# Patient Record
Sex: Male | Born: 1980 | Race: White | Hispanic: No | Marital: Married | Smoking: Never smoker
Health system: Southern US, Community
[De-identification: ages and names within clinical notes are randomized; demographics above are authoritative.]

## PROBLEM LIST (undated history)

## (undated) HISTORY — PX: KNEE ARTHROSCOPY: SUR90

---

## 2008-08-25 ENCOUNTER — Emergency Department: Payer: Self-pay | Admitting: Emergency Medicine

## 2009-03-01 ENCOUNTER — Emergency Department: Payer: Self-pay | Admitting: Emergency Medicine

## 2009-03-06 ENCOUNTER — Emergency Department: Payer: Self-pay | Admitting: Emergency Medicine

## 2009-03-08 ENCOUNTER — Emergency Department: Payer: Self-pay | Admitting: Emergency Medicine

## 2009-03-13 ENCOUNTER — Ambulatory Visit: Payer: Self-pay | Admitting: Internal Medicine

## 2009-07-01 ENCOUNTER — Emergency Department: Payer: Self-pay | Admitting: Emergency Medicine

## 2009-07-02 ENCOUNTER — Emergency Department: Payer: Self-pay | Admitting: Emergency Medicine

## 2009-08-11 ENCOUNTER — Emergency Department: Payer: Self-pay | Admitting: Emergency Medicine

## 2009-09-15 ENCOUNTER — Inpatient Hospital Stay: Payer: Self-pay | Admitting: Internal Medicine

## 2010-02-15 ENCOUNTER — Emergency Department: Payer: Self-pay | Admitting: Emergency Medicine

## 2010-04-03 ENCOUNTER — Emergency Department: Payer: Self-pay | Admitting: Emergency Medicine

## 2010-05-21 ENCOUNTER — Ambulatory Visit: Payer: Self-pay | Admitting: Family Medicine

## 2011-10-03 IMAGING — CT CT HEAD WITHOUT CONTRAST
2 series · 16 of 30 positions shown, 20 images · non-contrast
Comparison: none

REASON FOR EXAM: 4 week of increasing headache
COMMENTS:

PROCEDURE:     CT  - CT HEAD WITHOUT CONTRAST  - February 15, 2010  [DATE]
RESULT:     Comparison:  None
TECHNIQUE: Multiple axial images from the foramen magnum to the vertex were
obtained without IV contrast.

[Series 2: without · axial · non-contrast · 0.46mm/px · z∈[+354,+489]mm · 13 of 33 slices shown, 17 images]
[im 3/33  brain]
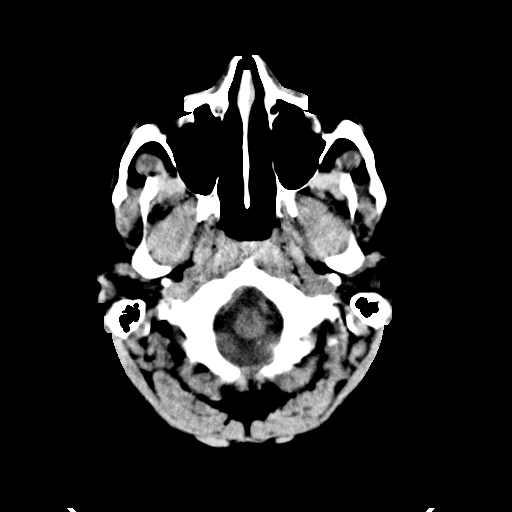
[im 3/33  bone]
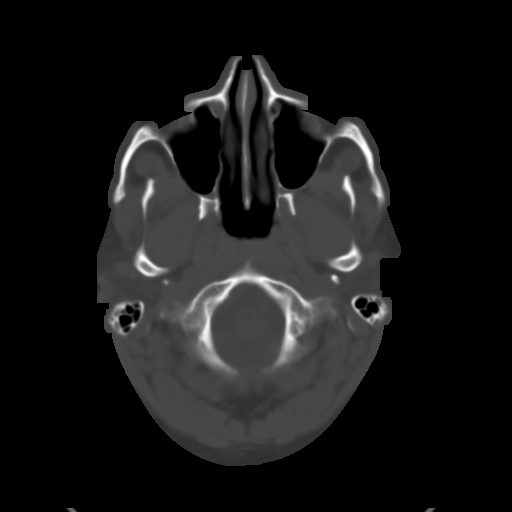
[im 5/33  brain]
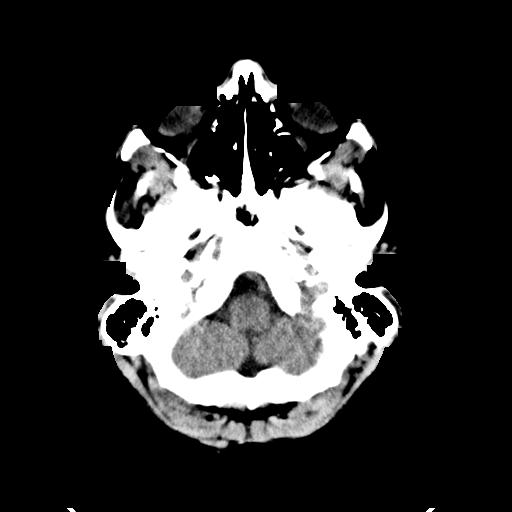
[im 7/33  brain]
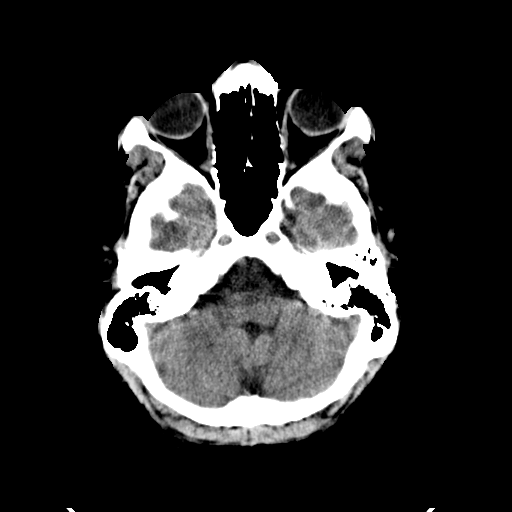
[im 10/33  brain]
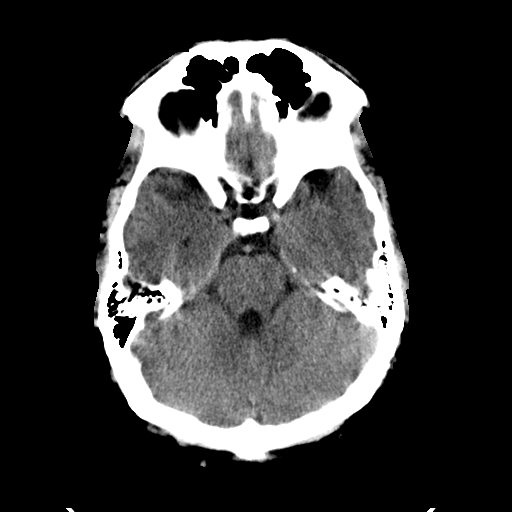
[im 12/33  brain]
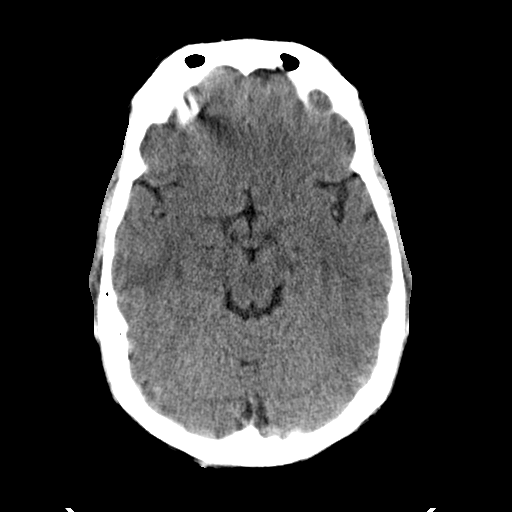
[im 12/33  bone]
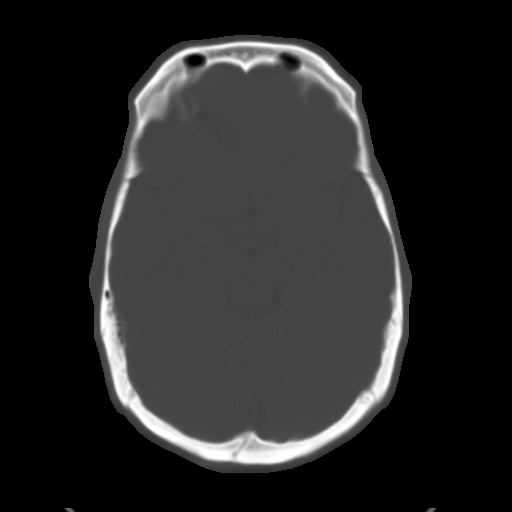
[im 14/33  brain]
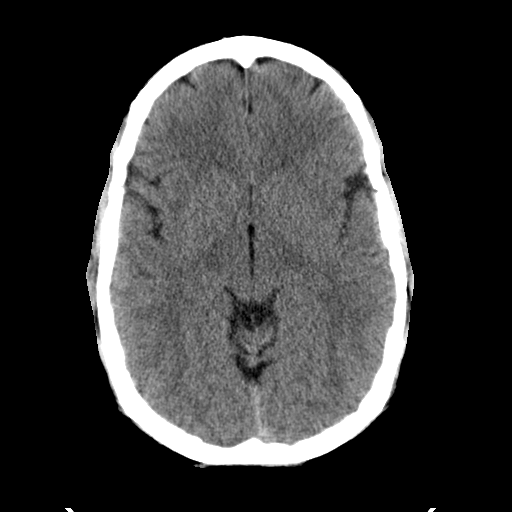
[im 17/33  brain]
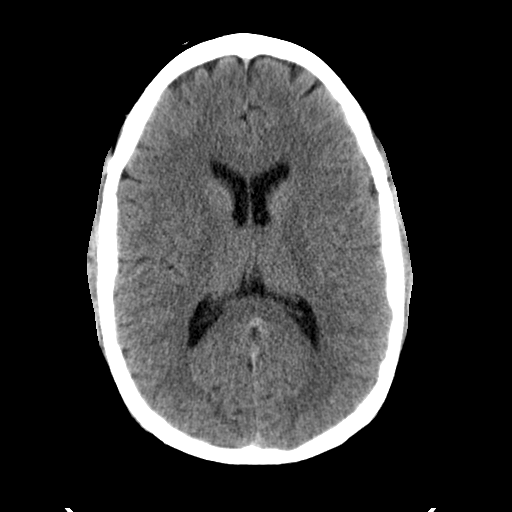
[im 19/33  brain]
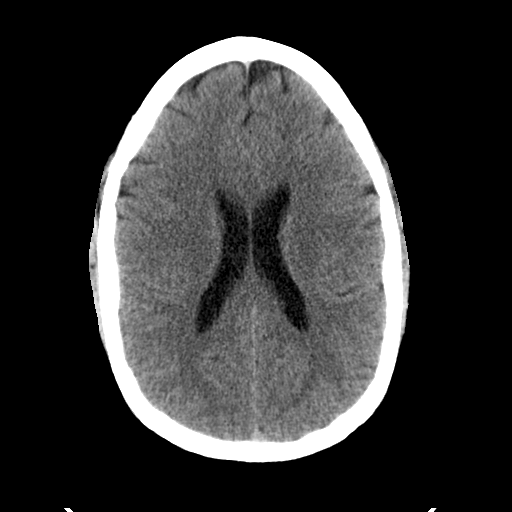
[im 21/33  brain]
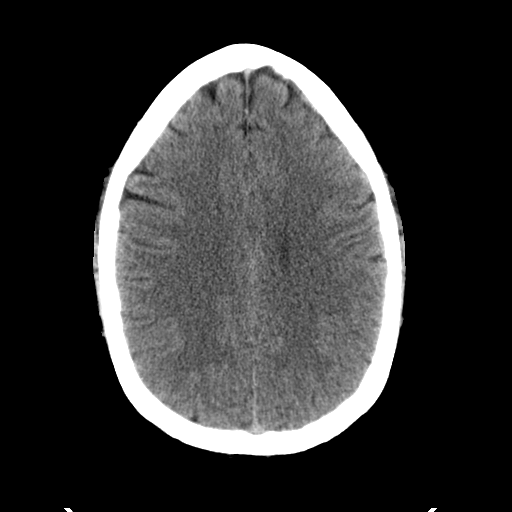
[im 21/33  bone]
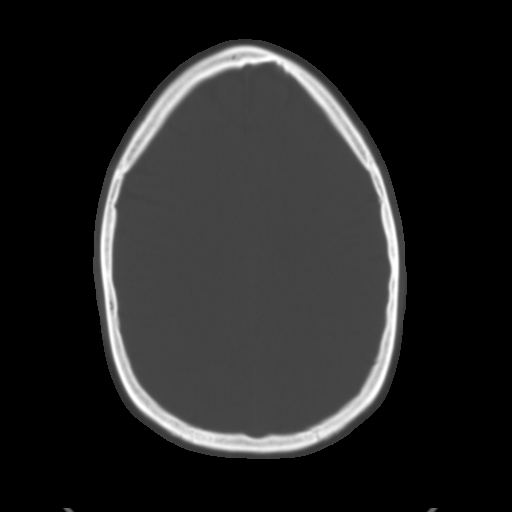
[im 23/33  brain]
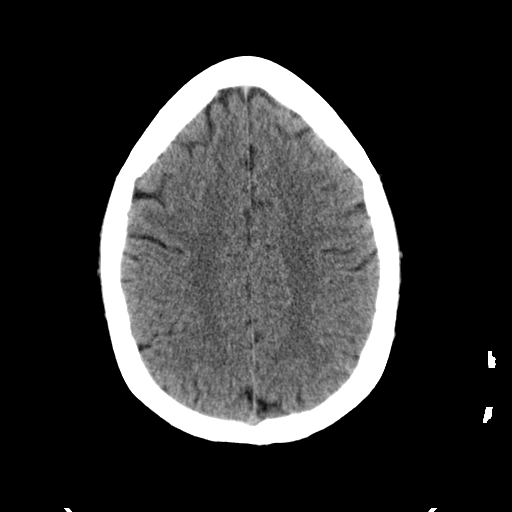
[im 26/33  brain]
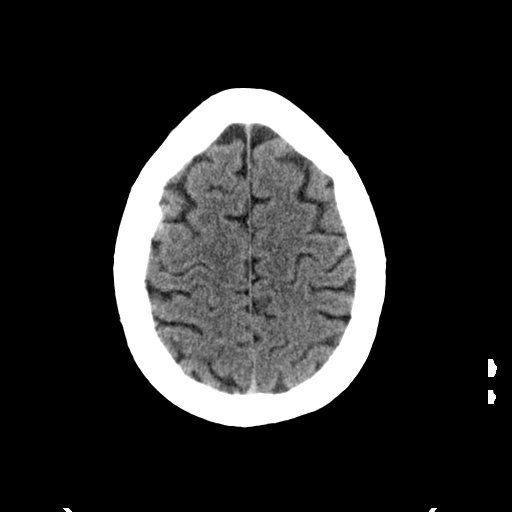
[im 28/33  brain]
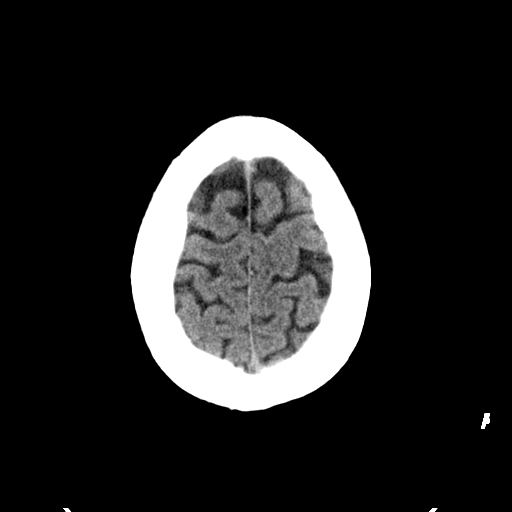
[im 30/33  brain]
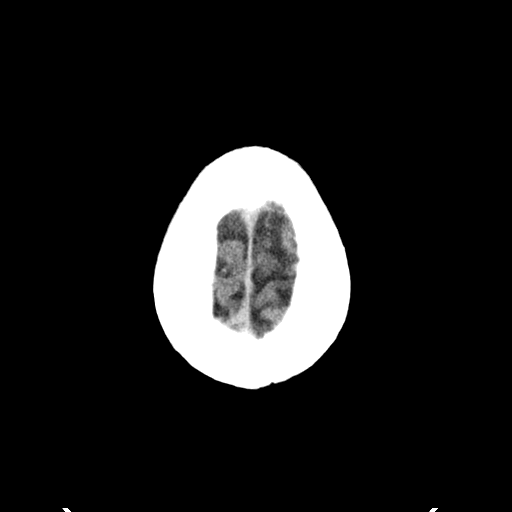
[im 30/33  bone]
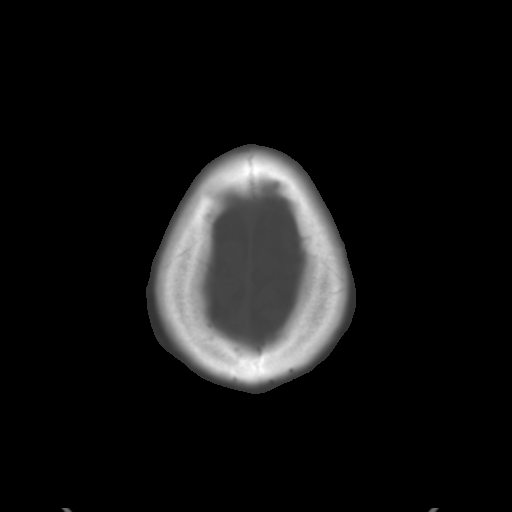

[Series 3: bone · axial · 0.46mm/px · z∈[+354,+399]mm · 3 of 33 slices shown]
[im 3/33  bone]
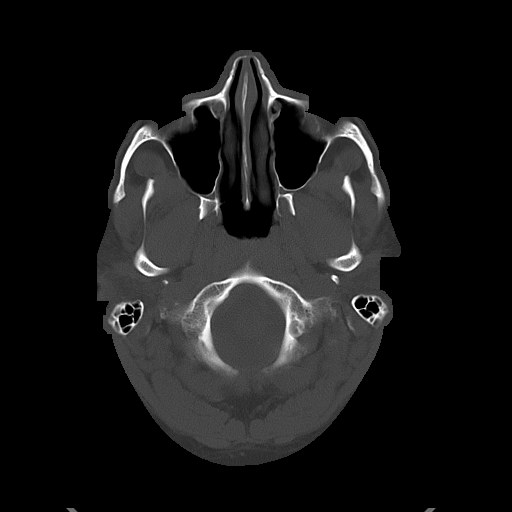
[im 7/33  bone]
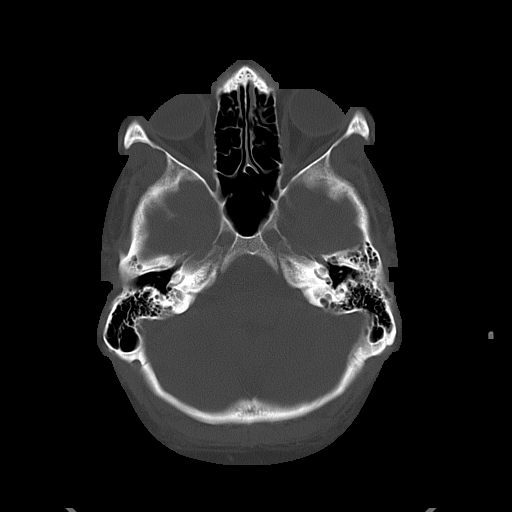
[im 12/33  bone]
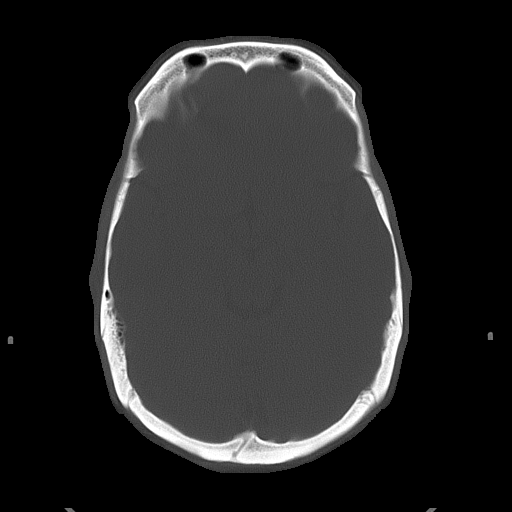

[16 of 30 positions shown; findings below may reference images not displayed]

FINDINGS: There is no evidence of mass effect, midline shift, or extra-axial fluid
collections.  There is no evidence of a space-occupying lesion or
intracranial hemorrhage. There is no evidence of a cortical-based area of
acute infarction.

The ventricles and sulci are appropriate for the patient's age. The basal
cisterns are patent.

Visualized portions of the orbits are unremarkable. The visualized portions
of the paranasal sinuses and mastoid air cells are unremarkable.

The osseous structures are unremarkable.
IMPRESSION: No acute intracranial process.

## 2018-02-14 ENCOUNTER — Ambulatory Visit
Admission: EM | Admit: 2018-02-14 | Discharge: 2018-02-14 | Disposition: A | Discharge status: Home / Self Care | Payer: Self-pay | Attending: Emergency Medicine | Admitting: Emergency Medicine

## 2018-02-14 ENCOUNTER — Other Ambulatory Visit: Payer: Self-pay

## 2018-02-14 DIAGNOSIS — J029 Acute pharyngitis, unspecified: Secondary | ICD-10-CM | POA: Insufficient documentation

## 2018-02-14 LAB — RAPID STREP SCREEN (MED CTR MEBANE ONLY): Streptococcus, Group A Screen (Direct): NEGATIVE

## 2018-02-14 NOTE — ED Provider Notes (Signed)
[facility]    CSN: [id number] Arrival date & time: 02/14/18  0810     History   Chief Complaint Chief Complaint  Patient presents with  . Sore Throat    HPI Dartha LodgeJoshua Bluett is a 37 y.o. male.   HPI  -year-old male presents with sore throat that started about a week ago.  He states that he is noticed some swelling and some soreness on the right side submandibular there recently has made his inside of his throat sore.  7049-month-old has recently recovered from a severe ear infection that required several courses of antibiotics before "cure.  Is a non-smoker.  Denies any fever or chills.  It affects only the right side of his throat.  He is concerned regarding a small lump on the right side about the size of a fingertip.  He states that he was concerned because it is sore.  He has not noticed any growth.  He denies any weight loss.         History reviewed. No pertinent past medical history.  There are no active problems to display for this patient.   Past Surgical History:  Procedure Laterality Date  . KNEE ARTHROSCOPY Right        Home Medications    Prior to Admission medications   Medication Sig Start Date End Date Taking? Authorizing Provider  Turmeric 400 MG CAPS Take by mouth.   Yes [provider]    Family History Family History  Problem Relation Age of Onset  . Renal cancer Father     Social History Social History   Tobacco Use  . Smoking status: Never Smoker  . Smokeless tobacco: Never Used  Substance Use Topics  . Alcohol use: Yes    Comment: occasionally  . Drug use: Not Currently     Allergies   Patient has no known allergies.   Review of Systems Review of Systems  Constitutional: Negative for activity change, appetite change, chills, fatigue and fever.  HENT: Positive for sore throat.   All other systems reviewed and are negative.    Physical Exam Triage Vital Signs ED Triage Vitals  Enc Vitals Group     BP 02/14/18 0825 (!) 142/93     Pulse Rate 02/14/18 0825 67     Resp 02/14/18 0825 18     Temp 02/14/18 0825 98.3 F (36.8 C)     Temp Source 02/14/18 0825 Oral     SpO2 02/14/18 0825 100 %     Weight 02/14/18 0821 220 lb (99.8 kg)     Height 02/14/18 0821 5\' 10"  (1.778 m)     Head Circumference --      Peak Flow --      Pain Score 02/14/18 0821 4     Pain Loc --      Pain Edu? --      Excl. in GC? --    No data found.  Updated Vital Signs BP (!) 142/93 (BP Location: Right Arm)   Pulse 67   Temp 98.3 F (36.8 C) (Oral)   Resp 18   Ht 5\' 10"  (1.778 m)   Wt 220 lb (99.8 kg)   SpO2 100%   BMI 31.57 kg/m   Visual Acuity Right Eye Distance:   Left Eye Distance:   Bilateral Distance:    Right Eye Near:   Left Eye Near:    Bilateral Near:     Physical Exam Vitals signs and nursing note reviewed.  Constitutional:      General: He is not in acute distress.    Appearance: He is well-developed and normal weight. He is not ill-appearing, toxic-appearing or diaphoretic.  HENT:     Head: Normocephalic.     Right Ear: Tympanic membrane and ear canal normal.     Left Ear: Tympanic membrane and ear canal normal.     Nose: No congestion or rhinorrhea.     Comments: And has a very low tender lymph node submandibular.  Fixed to the skin.  Is slightly mobile.    Mouth/Throat:     Mouth: Mucous membranes are moist. No oral lesions.     Pharynx: No pharyngeal swelling, oropharyngeal exudate, posterior oropharyngeal erythema or uvula swelling.     Tonsils: No tonsillar exudate or tonsillar abscesses. Swelling: 0 on the right. 0 on the left.  Eyes:     Conjunctiva/sclera: Conjunctivae normal.  Neck:     Musculoskeletal: Normal range of motion and neck supple.  Pulmonary:     Effort: Pulmonary effort is normal.     Breath sounds: Normal breath sounds.  Lymphadenopathy:     Cervical: Cervical adenopathy present.  Skin:    General: Skin is warm and dry.  Neurological:      General: No focal deficit present.     Mental Status: He is alert and oriented to person, place, and time.  Psychiatric:        Mood and Affect: Mood normal.        Behavior: Behavior normal.      UC Treatments / Results  Labs (all labs ordered are listed, but only abnormal results are displayed) Labs Reviewed  RAPID STREP SCREEN ([facility] ONLY)  CULTURE, GROUP A STREP [facility])    EKG None  Radiology No results found.  Procedures Procedures (including critical care time)  Medications Ordered in UC Medications - No data to display  Initial Impression / Assessment and Plan / UC Course  I have reviewed the triage vital signs and the nursing notes.  Pertinent labs & imaging results that were available during my care of the patient were reviewed by me and considered in my medical decision making (see chart for details).   She has a negative strep test and the lymph node is not alarming.  I have reassured him that this is possibly from previous infection.  He should keep close eye on it as far as any growth changes.  I suspect that it will disappear very shortly.  If it does not he should make an arrangement to follow-up with an ear nose and throat physician.  In the meantime I have told him that the sore throat can be treated conservatively and he likely viral does not require antibiotics.  You can use salt water gargles or lozenges for comfort.  Ibuprofen for soreness.  Results of the cultures and sensitivities will be available in 48 hours.   Final Clinical Impressions(s) / UC Diagnoses   Final diagnoses:  Sore throat   Discharge Instructions   None    ED Prescriptions    None     Controlled Substance Prescriptions [place] Controlled Substance Registry consulted? Not Applicable   Lutricia FeilRoemer, Sosie Gato P, PA-C 02/14/18 [id number]

## 2018-02-14 NOTE — ED Triage Notes (Signed)
Patient complains of sore throat that started 1 week ago. Patient states that he has been noticing a swelling and soreness and then recently inside of throat started to be sore.

## 2018-02-16 LAB — CULTURE, GROUP A STREP (THRC)

## 2018-06-25 ENCOUNTER — Encounter: Payer: Self-pay | Admitting: Emergency Medicine

## 2018-06-25 ENCOUNTER — Ambulatory Visit
Admission: EM | Admit: 2018-06-25 | Discharge: 2018-06-25 | Disposition: A | Discharge status: Home / Self Care | Payer: Self-pay | Attending: Urgent Care | Admitting: Urgent Care

## 2018-06-25 ENCOUNTER — Other Ambulatory Visit: Payer: Self-pay

## 2018-06-25 DIAGNOSIS — J301 Allergic rhinitis due to pollen: Secondary | ICD-10-CM

## 2018-06-25 DIAGNOSIS — R05 Cough: Secondary | ICD-10-CM

## 2018-06-25 DIAGNOSIS — R0789 Other chest pain: Secondary | ICD-10-CM

## 2018-06-25 DIAGNOSIS — R059 Cough, unspecified: Secondary | ICD-10-CM

## 2018-06-25 MED ORDER — ALBUTEROL SULFATE HFA 108 (90 BASE) MCG/ACT IN AERS: 1.0000 | INHALATION_SPRAY | Freq: Four times a day (QID) | RESPIRATORY_TRACT | 0 refills | Status: AC | PRN

## 2018-06-25 NOTE — Discharge Instructions (Signed)
It was very nice meeting you today in clinic. Thank you for entrusting me with your care.  ° °Please utilize the medications that we discussed. Your prescriptions have been called in to your pharmacy.  ° °Make arrangements to follow up with your regular doctor in 1 week for re-evaluation. If your symptoms/condition worsens, please seek follow up care either here or in the ER. Please remember, our [facility] providers are "right here with you" when you need us.  ° °Again, it was my pleasure to take care of you today. Thank you for choosing our clinic. I hope that you start to feel better quickly.  ° °Petrea Fredenburg, MSN, APRN, FNP-C, CEN °Advanced Practice Provider °[facility] ° °

## 2018-06-25 NOTE — ED Provider Notes (Signed)
[address] [place], [place] [address] [phone]   Name: Travis Perez DOB: 13-May-1980 MRN: [id number] CSN: [id number] PCP: System, Pcp Not In  Arrival date and time:  06/25/18 1000  Chief Complaint:  Sinus Problem  NOTE: Prior to seeing the patient today, I have reviewed the triage nursing documentation and vital signs. Clinical staff has updated patient's PMH/PSHx, current medication list, and drug allergies/intolerances to ensure comprehensive history available to assist in medical decision making.   History:   HPI: Travis Perez is a 38 y.o. male who presents today with complaints of sinus and chest congestion for the last 6 weeks.  Patient denies any cough, significant shortness of breath, or fever.  Patient states, "it feels like I have something in there that if I could just cough up I would be fine".  He notes some clear postnasal drip. On 06/24/2018, patient developed an episode of chest tightness for which he used his son's prescribed budesonide SVN.  He notes that this intervention was effective in helping relieve his symptoms.  Additionally, patient has been using Mucinex on an inconsistent basis.  History reviewed. No pertinent past medical history.  Past Surgical History:  Procedure Laterality Date  . KNEE ARTHROSCOPY Right     Family History  Problem Relation Age of Onset  . Renal cancer Father     Social History   Socioeconomic History  . Marital status: Married    Spouse name: Not on file  . Number of children: Not on file  . Years of education: Not on file  . Highest education level: Not on file  Occupational History  . Not on file  Social Needs  . Financial resource strain: Not on file  . Food insecurity:    Worry: Not on file    Inability: Not on file  . Transportation needs:    Medical: Not on file    Non-medical: Not on file  Tobacco Use  . Smoking status: Never Smoker  . Smokeless tobacco: Never Used  Substance and Sexual  Activity  . Alcohol use: Yes    Comment: occasionally  . Drug use: Not Currently  . Sexual activity: Not on file  Lifestyle  . Physical activity:    Days per week: Not on file    Minutes per session: Not on file  . Stress: Not on file  Relationships  . Social connections:    Talks on phone: Not on file    Gets together: Not on file    Attends religious service: Not on file    Active member of club or organization: Not on file    Attends meetings of clubs or organizations: Not on file    Relationship status: Not on file  . Intimate partner violence:    Fear of current or ex partner: Not on file    Emotionally abused: Not on file    Physically abused: Not on file    Forced sexual activity: Not on file  Other Topics Concern  . Not on file  Social History Narrative  . Not on file    There are no active problems to display for this patient.   Home Medications:    Current Meds  Medication Sig  . Turmeric 400 MG CAPS Take by mouth.    Allergies:   Patient has no known allergies.  Review of Systems (ROS): Review of Systems  Constitutional: Negative for chills, fatigue and fever.  HENT: Positive for congestion, postnasal drip and rhinorrhea. Negative for  sore throat.   Eyes: Positive for itching. Negative for photophobia, redness and visual disturbance.  Respiratory: Positive for chest tightness (x 1 episode on 06/24/2018). Negative for cough and shortness of breath.   Cardiovascular: Negative for chest pain and palpitations.  Gastrointestinal: Negative for diarrhea, nausea and vomiting.  Musculoskeletal: Negative for arthralgias, back pain and myalgias.  Allergic/Immunologic: Positive for environmental allergies (seasonal).  Neurological: Negative for dizziness, light-headedness and headaches.  Hematological: Negative for adenopathy.     Physical Exam:  Triage Vital Signs ED Triage Vitals  Enc Vitals Group     BP 06/25/18 1010 (!) 160/95     Pulse Rate 06/25/18  1010 77     Resp 06/25/18 1010 16     Temp 06/25/18 1010 98.1 F (36.7 C)     Temp Source 06/25/18 1010 Oral     SpO2 06/25/18 1010 100 %     Weight 06/25/18 1007 225 lb (102.1 kg)     Height 06/25/18 1007  (1.778 m)     Head Circumference --      Peak Flow --      Pain Score 06/25/18 1007 0     Pain Loc --      Pain Edu? --      Excl. in GC? --     Physical Exam  Constitutional: He is oriented to person, place, and time and well-developed, well-nourished, and in no distress.  HENT:  Head: Normocephalic and atraumatic.  Right Ear: External ear normal.  Left Ear: External ear normal.  Mouth/Throat: Oropharynx is clear and moist and mucous membranes are normal.  Eyes: Pupils are equal, round, and reactive to light. Conjunctivae and EOM are normal.  Neck: Normal range of motion. Neck supple. No tracheal deviation present.  Cardiovascular: Normal rate, regular rhythm, normal heart sounds and intact distal pulses. Exam reveals no gallop and no friction rub.  No murmur heard. Pulmonary/Chest: Effort normal and breath sounds normal. No respiratory distress. He has no wheezes. He has no rales. He exhibits no tenderness.  Lymphadenopathy:    He has no cervical adenopathy.  Neurological: He is alert and oriented to person, place, and time.  Skin: Skin is warm and dry. No rash noted. No erythema.  Psychiatric: Mood, affect and judgment normal.  Nursing note and vitals reviewed.    Urgent Care Treatments / Results:   LABS: PLEASE NOTE: all labs that were ordered this encounter are listed, however only abnormal results are displayed. Labs Reviewed - No data to display  EKG: No orders found for this or any previous visit.  RADIOLOGY: No results found.  PRODEDURES: Procedures  MEDICATIONS RECEIVED THIS VISIT: Medications - No data to display  PERTINENT CLINICAL COURSE NOTES/UPDATES: No data to display   Initial Impression / Assessment and Plan / Urgent Care Course:     Travis Perez is a 38 y.o. male who presents to [facility] today with complaints of Sinus Problem  Pertinent labs & imaging results that were available during my care of the patient were personally reviewed by me and considered in my medical decision making (see lab/imaging section of note for values and interpretations).  Patient presents with complaints of chronic sinus and chest congestion x6 weeks.  No fevers.  No cough.  Patient using Mucinex and Allegra to help with the symptoms.  Patient experienced one episode of chest tightness yesterday for which she used his son's budesonide.  He notes that this intervention was effective.  Discussed CXR,  however patient notes that he is "self-pay" and wishes to forego imaging at this time.  Symptoms most consistent with untreated allergic rhinitis.  Patient using allergy medications on an infrequent basis.  Discussed daily treatment with allergy medication to help with the symptoms.  Patient requesting SVN solution so that he does not have to use his son's medication.  Again, patient has no insurance and is requesting something that will not "constant arm and leg".  Given the patient was traveling when his chest tightness occurred, discussed utility of patient having a portable intervention in place that he could use in the event of any recurrent shortness of breath or chest tightness.  Patient agrees with discussed plan of care.  He was provided with a prescription for an albuterol MDI, along with a good Rx coupon allowing patient to obtain prescription for <$25.  Discussed follow up with primary care physician this week for re-evaluation. I have reviewed the follow up and strict return precautions for any new or worsening symptoms. Patient is aware of symptoms that would be deemed urgent/emergent, and would thus require further evaluation either here or in the emergency department. At the time of discharge, he verbalized understanding and consent with  the discharge plan as it was reviewed with him. All questions were fielded by provider and/or clinic staff prior to patient discharge.    Final Clinical Impressions(s) / Urgent Care Diagnoses:   Final diagnoses:  Seasonal allergic rhinitis due to pollen  Cough  Chest tightness    New Prescriptions:   Meds ordered this encounter  Medications  . albuterol (VENTOLIN HFA) 108 (90 Base) MCG/ACT inhaler    Sig: Inhale 1-2 puffs into the lungs every 6 (six) hours as needed for wheezing or shortness of breath.    Dispense:  1 Inhaler    Refill:  0    Controlled Substance Prescriptions:  [place] Controlled Substance Registry consulted? Not Applicable  NOTE: This note was prepared using Dragon dictation software along with smaller phrase technology. Despite my best ability to proofread, there is the potential that transcriptional errors may still occur from this process, and are completely unintentional.     Verlee MonteGray, Draysen Weygandt E, NP 06/25/18 [phone]

## 2018-06-25 NOTE — ED Triage Notes (Signed)
Patient c/o sinus and chest congestion for 6 weeks.  Patient denies fevers.

## 2024-04-06 ENCOUNTER — Ambulatory Visit: Payer: Self-pay | Admitting: Internal Medicine
